# Patient Record
Sex: Female | Born: 1980 | Hispanic: Yes | Marital: Married | Smoking: Never smoker
Health system: Southern US, Community
[De-identification: ages and names within clinical notes are randomized; demographics above are authoritative.]

## PROBLEM LIST (undated history)

## (undated) DIAGNOSIS — J45909 Unspecified asthma, uncomplicated: Secondary | ICD-10-CM

## (undated) DIAGNOSIS — J309 Allergic rhinitis, unspecified: Secondary | ICD-10-CM

## (undated) HISTORY — DX: Allergic rhinitis, unspecified: J30.9

## (undated) HISTORY — DX: Unspecified asthma, uncomplicated: J45.909

---
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
## 2017-06-18 DIAGNOSIS — N920 Excessive and frequent menstruation with regular cycle: Secondary | ICD-10-CM | POA: Diagnosis not present

## 2017-06-18 DIAGNOSIS — N83209 Unspecified ovarian cyst, unspecified side: Secondary | ICD-10-CM | POA: Diagnosis not present

## 2017-06-18 DIAGNOSIS — N938 Other specified abnormal uterine and vaginal bleeding: Secondary | ICD-10-CM | POA: Diagnosis not present

## 2017-06-25 ENCOUNTER — Encounter: Payer: Self-pay | Admitting: Obstetrics & Gynecology

## 2017-06-25 ENCOUNTER — Ambulatory Visit (INDEPENDENT_AMBULATORY_CARE_PROVIDER_SITE_OTHER): Payer: BLUE CROSS/BLUE SHIELD | Admitting: Obstetrics & Gynecology

## 2017-06-25 VITALS — BP 120/80 | HR 87 | Ht 65.0 in | Wt 188.0 lb

## 2017-06-25 DIAGNOSIS — N920 Excessive and frequent menstruation with regular cycle: Secondary | ICD-10-CM | POA: Insufficient documentation

## 2017-06-25 NOTE — Progress Notes (Signed)
HPI:      Teresa Willis is a 36 y.o. G1P1001 who LMP was Patient's last menstrual period was 05/22/2017., presents today for a problem visit.  She complains of menorrhagia that  began about a month ago and its severity is described as moderate.  She has regular periods every 28 days and they are associated with no menstrual cramping.  She has used the following for attempts at control: maxi pad.  July was prolonged with almost 30 days of bleeding, clots at times, no pain.  Previous evaluation: none. Prior Diagnosis: none. Previous Treatment: none.  She is single partner, contraception - vasectomy.  Contraception: vasectomy. Hx of STDs: none. She is premenopausal.  PMHx: She  has no past medical history on file. Also,  has a past surgical history that includes Appendectomy and Cesarean section., family history includes Colon cancer in her paternal grandfather.,  reports that she has never smoked. She has never used smokeless tobacco. She reports that she does not drink alcohol or use drugs.  She currently has no medications in their medication list. Also, is allergic to sulfa antibiotics.  Review of Systems  Constitutional: Negative for chills, fever and malaise/fatigue.  HENT: Negative for congestion, sinus pain and sore throat.   Eyes: Negative for blurred vision and pain.  Respiratory: Negative for cough and wheezing.   Cardiovascular: Negative for chest pain and leg swelling.  Gastrointestinal: Negative for abdominal pain, constipation, diarrhea, heartburn, nausea and vomiting.  Genitourinary: Negative for dysuria, frequency, hematuria and urgency.  Musculoskeletal: Negative for back pain, joint pain, myalgias and neck pain.  Skin: Negative for itching and rash.  Neurological: Negative for dizziness, tremors and weakness.  Endo/Heme/Allergies: Does not bruise/bleed easily.  Psychiatric/Behavioral: Negative for depression. The patient is not nervous/anxious and does not  have insomnia.     Objective: BP 120/80   Pulse 87   Ht 5\' 5"  (1.651 m)   Wt 188 lb (85.3 kg)   LMP 05/22/2017   BMI 31.28 kg/m  Physical Exam  Constitutional: She is oriented to person, place, and time. She appears well-developed and well-nourished. No distress.  Genitourinary: Vagina normal and uterus normal. Pelvic exam was performed with patient supine. There is no rash, tenderness or lesion on the right labia. There is no rash, tenderness or lesion on the left labia. No erythema or bleeding in the vagina. Right adnexum does not display mass and does not display tenderness. Left adnexum does not display mass and does not display tenderness. Cervix does not exhibit motion tenderness, discharge, polyp or nabothian cyst.   Uterus is mobile and midaxial. Uterus is not enlarged or exhibiting a mass.  Cardiovascular: Regular rhythm.  Exam reveals no gallop.   No murmur heard. Pulmonary/Chest: Effort normal and breath sounds normal.  Abdominal: Soft. She exhibits no distension. There is no tenderness.  Musculoskeletal: Normal range of motion.  Neurological: She is alert and oriented to person, place, and time. No cranial nerve deficit.  Skin: Skin is warm and dry.  Psychiatric: She has a normal mood and affect.    ASSESSMENT/PLAN:  menorrhagia  Problem List Items Addressed This Visit      Other   Menorrhagia with regular cycle - Primary   Relevant Orders   US Transvaginal Non-OB    Patient has abnormal uterine bleeding . She has a normal exam today, with no evidence of lesions.  Evaluation includes the following: exam, labs such as hormonal testing, and pelvic ultrasound to evaluate for any structural  gynecologic abnormalities.  Patient to follow up after testing.  Treatment option for menorrhagia or menometrorrhagia discussed in great detail with the patient.  Options include hormonal therapy, IUD therapy such as Mirena, D&C, Ablation, and Hysterectomy.  The pros and cons of each  option discussed with patient.   Annamarie Major, MD, [facility], [facility] 06/25/2017  9:24 AM

## 2017-06-25 NOTE — Patient Instructions (Signed)
Menorragia  (Menorrhagia)  Se llama menorragia a los periodos menstruales abundantes o que duran ms de lo habitual.  CUIDADOS EN EL HOGAR   Slo tome los medicamentos que le haya indicado el mdico.   Tome los comprimidos de hierro segn las indicaciones del mdico. El sangrado abundante puede causar bajos niveles de hierro en el organismo.   Notome aspirina desde 1 semana antes ni durante el perodo menstrual. La aspirina puede hacer que la hemorragia empeore.   Recustese un rato si debe cambiar el tampn o el apsito ms de una vez en 2 horas. Esto puede ayudar a disminuir el sangrado.   Consuma una dieta saludable y alimentos ricos en hierro. Entre estos alimentos se incluyen vegetales de hoja verde, carne, hgado, huevos y panes y cereales de grano entero.   No trate de perder peso. Espere hasta que la hemorragia se haya detenido y sus niveles de hierro vuelvan a ser normales.    SOLICITE AYUDA SI:   Empapa un tampn o un apsito cada 1 o 2 horas, y esto le ocurre cada vez que tiene el perodo.   Necesita usar apsitos y tampones al mismo tiempo porque pierde demasiada sangre.   Debe cambiarse el apsito o el tampn durante la noche.   Tiene un perodo que dura ms de 8 das.   Elimina cogulos ms grandes que 1 pulgada (2,5 cm).   Tiene perodos irregulares que ocurren ms o menos de una vez al mes.   Se siente mareada o se desvanece (se desmaya).   Se siente muy dbil o cansada.   Le falta el aire o siente que el corazn late muy rpido al hacer ejercicios.   Tiene ganas de vomitar ( nuseas ) y devuelve ( vomita ) mientras est tomando el medicamento.   Tiene heces acuosas (diarrea) mientras toma los medicamentos.   Tiene algn problema que pueda relacionarse con el medicamento que est tomando.    SOLICITE AYUDA DE INMEDIATO SI:   Empapa 4 o ms apsitos o tampones en 2 horas.   Tiene sangrado y est embarazada.    ASEGRESE DE QUE:   Comprende estas instrucciones.   Controlar su  afeccin.   Recibir ayuda de inmediato si no mejora o si empeora.    Esta informacin no tiene como fin reemplazar el consejo del mdico. Asegrese de hacerle al mdico cualquier pregunta que tenga.  Document Released: 11/28/2010 Document Revised: 06/28/2013 Document Reviewed: 04/27/2013  Elsevier Interactive Patient Education  2017 [employer]

## 2017-06-29 LAB — IGP, APTIMA HPV
HPV Aptima: NEGATIVE
PAP SMEAR COMMENT: 0

## 2017-07-01 ENCOUNTER — Ambulatory Visit (INDEPENDENT_AMBULATORY_CARE_PROVIDER_SITE_OTHER): Payer: BLUE CROSS/BLUE SHIELD

## 2017-07-01 ENCOUNTER — Encounter: Payer: Self-pay | Admitting: Obstetrics & Gynecology

## 2017-07-01 ENCOUNTER — Ambulatory Visit (INDEPENDENT_AMBULATORY_CARE_PROVIDER_SITE_OTHER): Payer: BLUE CROSS/BLUE SHIELD | Admitting: Obstetrics & Gynecology

## 2017-07-01 VITALS — BP 100/70 | HR 75 | Ht 65.0 in | Wt 187.0 lb

## 2017-07-01 DIAGNOSIS — N920 Excessive and frequent menstruation with regular cycle: Secondary | ICD-10-CM

## 2017-07-01 NOTE — Progress Notes (Signed)
  HPI: Dysfunctional Uterine Bleeding Patient complains of irregular menses. She has had one recent period abnormality and one episode last year, but other periods are reg and 2-3 days.  Recent abnormality was prolonged and heavy.  See prior note.  Ultrasound demonstrates no masses seen These findings are Pelvis normal  PMHx: She  has no past medical history on file. Also,  has a past surgical history that includes Appendectomy and Cesarean section., family history includes Colon cancer in her paternal grandfather.,  reports that she has never smoked. She has never used smokeless tobacco. She reports that she does not drink alcohol or use drugs.  She currently has no medications in their medication list. Also, is allergic to sulfa antibiotics.  Review of Systems  All other systems reviewed and are negative.   Objective: BP 100/70   Pulse 75   Ht 5\' 5"  (1.651 m)   Wt 187 lb (84.8 kg)   BMI 31.12 kg/m   Physical examination Constitutional NAD, Conversant  Skin No rashes, lesions or ulceration.   Extremities: Moves all appropriately.  Normal ROM for age. No lymphadenopathy.  Neuro: Grossly intact  Psych: Oriented to PPT.  Normal mood. Normal affect.   Assessment:  Menorrhagia with regular cycle But most cycles still normal. Cont to watch for pattern. Treatment option for menorrhagia or menometrorrhagia discussed in great detail with the patient.  Options include hormonal therapy, IUD therapy such as Mirena, D&C, Ablation, and Hysterectomy.  The pros and cons of each option discussed with patient.   Annamarie Major, MD, [facility], [facility] 07/01/2017  10:52 AM

## 2017-08-03 ENCOUNTER — Ambulatory Visit: Payer: Self-pay | Admitting: Family Medicine

## 2017-08-05 ENCOUNTER — Encounter: Payer: Self-pay | Admitting: Family Medicine

## 2017-08-05 ENCOUNTER — Ambulatory Visit (INDEPENDENT_AMBULATORY_CARE_PROVIDER_SITE_OTHER): Payer: BLUE CROSS/BLUE SHIELD | Admitting: Family Medicine

## 2017-08-05 VITALS — BP 120/62 | HR 84 | Temp 98.0°F | Ht 63.0 in | Wt 186.0 lb

## 2017-08-05 DIAGNOSIS — E663 Overweight: Secondary | ICD-10-CM | POA: Insufficient documentation

## 2017-08-05 DIAGNOSIS — E669 Obesity, unspecified: Secondary | ICD-10-CM

## 2017-08-05 DIAGNOSIS — E66811 Obesity, class 1: Secondary | ICD-10-CM

## 2017-08-05 DIAGNOSIS — Z0000 Encounter for general adult medical examination without abnormal findings: Secondary | ICD-10-CM | POA: Insufficient documentation

## 2017-08-05 NOTE — Assessment & Plan Note (Signed)
Preventative protocols reviewed and updated unless pt declined. Discussed healthy diet and lifestyle.  

## 2017-08-05 NOTE — Progress Notes (Signed)
BP 120/62 (BP Location: Left Arm, Patient Position: Sitting, Cuff Size: Normal)   Pulse 84   Temp 98 F (36.7 C) (Oral)   Ht  (1.6 m)   Wt 186 lb (84.4 kg)   LMP 07/12/2017   SpO2 98%   BMI 32.95 kg/m    CC: new pt to establish Subjective:    Patient ID: Teresa Willis, female    DOB: [birth year], 36 y.o.   MRN: [id number]  HPI: Teresa Willis is a 36 y.o. female presenting on 08/05/2017 for New Patient (Initial Visit)   Spanish speaking patient.  Saw OBGYN Dr Tiburcio Pea for menorrhagia, treated with watchful waiting. Cycles have normalized.   Preventative: Well woman with OBGYN Dr Tiburcio Pea, pap WNL 06/2017 labwork - had labwork done 1 yr ago.  Flu shot - declines Tetanus 2010 - will bring [place] immunization records Seat belt use discussed Sunscreen use discussed. No changing moles on skin.  Non smoker Alcohol  None LMP 07/2017  Lives with husband, son Barbara Cower and step daughter Annye Asa: unemployed, housewife Activity: walking at local park - every morning Diet: good water intake, fruits/vegetables daily, doesn't like fish or seafood  Relevant past medical, surgical, family and social history reviewed and updated as indicated. Interim medical history since our last visit reviewed. Allergies and medications reviewed and updated. No outpatient prescriptions prior to visit.   No facility-administered medications prior to visit.      Per HPI unless specifically indicated in ROS section below Review of Systems     Objective:    BP 120/62 (BP Location: Left Arm, Patient Position: Sitting, Cuff Size: Normal)   Pulse 84   Temp 98 F (36.7 C) (Oral)   Ht  (1.6 m)   Wt 186 lb (84.4 kg)   LMP 07/12/2017   SpO2 98%   BMI 32.95 kg/m   Wt Readings from Last 3 Encounters:  08/05/17 186 lb (84.4 kg)  07/01/17 187 lb (84.8 kg)  06/25/17 188 lb (85.3 kg)    Physical Exam  Constitutional: She is oriented to person, place, and time. She appears well-developed  and well-nourished. No distress.  HENT:  Head: Normocephalic and atraumatic.  Right Ear: Hearing, tympanic membrane, external ear and ear canal normal.  Left Ear: Hearing, tympanic membrane, external ear and ear canal normal.  Nose: Nose normal.  Mouth/Throat: Uvula is midline, oropharynx is clear and moist and mucous membranes are normal. No oropharyngeal exudate, posterior oropharyngeal edema or posterior oropharyngeal erythema.  Eyes: Pupils are equal, round, and reactive to light. Conjunctivae and EOM are normal. No scleral icterus.  Neck: Normal range of motion. Neck supple. No thyromegaly present.  Cardiovascular: Normal rate, regular rhythm, normal heart sounds and intact distal pulses.   No murmur heard. Pulses:      Radial pulses are 2+ on the right side, and 2+ on the left side.  Pulmonary/Chest: Effort normal and breath sounds normal. No respiratory distress. She has no wheezes. She has no rales.  Abdominal: Soft. Bowel sounds are normal. She exhibits no distension and no mass. There is no tenderness. There is no rebound and no guarding.  Musculoskeletal: Normal range of motion. She exhibits no edema.  Lymphadenopathy:    She has no cervical adenopathy.  Neurological: She is alert and oriented to person, place, and time.  CN grossly intact, station and gait intact  Skin: Skin is warm and dry. No rash noted.  Psychiatric: She has a normal mood and affect. Her behavior  is normal. Judgment and thought content normal.  Nursing note and vitals reviewed.     Assessment & Plan:   Problem List Items Addressed This Visit    Health maintenance examination - Primary    Preventative protocols reviewed and updated unless pt declined. Discussed healthy diet and lifestyle.       Obesity, Class I, BMI 30-34.9    Reviewed active lifestyle changes.       Relevant Orders   Lipid panel   Comprehensive metabolic panel   TSH       Follow up plan: Return in about 1 year (around  08/05/2018) for annual exam, prior fasting for blood work.  Eustaquio Boyden, MD

## 2017-08-05 NOTE — Assessment & Plan Note (Signed)
Reviewed active lifestyle changes.

## 2017-08-05 NOTE — Patient Instructions (Addendum)
Gusto verla hoy. regresar en ayunas la semana entrante para laboratorios. regresar cuando necesite o en 1 ao para proximo examen fisico.  RTC 1 wk labwork.   [place] (Health Maintenance, Female) Un estilo de vida saludable y los cuidados preventivos pueden favorecer considerablemente a la salud y Musician. Pregunte a su mdico cul es el cronograma de exmenes peridicos apropiado para usted. Esta es una buena oportunidad para consultarlo sobre cmo prevenir enfermedades y [place] sano. Adems de los controles, hay muchas otras cosas que puede hacer usted mismo. Los expertos han realizado numerosas investigaciones [employer]itor cambios en el estilo de vida y las medidas de prevencin que, [place], lo ayudarn a mantenerse sano. Solicite a su mdico ms informacin. EL PESO Y LA DIETA Consuma una dieta saludable.  Asegrese de [employer] Stores verduras, frutas, productos lcteos de bajo contenido de [place] y Advertising account planner.  No consuma muchos alimentos de alto contenido de grasas slidas, azcares agregados o sal.  Realice actividad fsica con regularidad. Esta es una de las prcticas ms importantes que puede hacer por su salud. ? La Delorise Shiner de los adultos deben hacer ejercicio durante al menos 179mnutos por semana. El ejercicio debe aumentar la frecuencia cardaca y pActorla transpiracin (ejercicio de [place]. ? La mayora de los adultos tambin deben hacer ejercicios de elongacin al mToysRusveces a la semana. Agregue esto al su plan de ejercicio de intensidad moderada. Mantenga un peso saludable.  El ndice de masa corporal ([facility] es una medida que puede utilizarse para identificar posibles problemas de pBladensburg Proporciona una estimacin de la grasa corporal basndose en el peso y la altura. Su mdico puede ayudarle a dRadiation protection practitionerIDakotay a lScientist, forensico mTheatre managerun peso saludable.  Para las mujeres de 20aos o ms: ? Un IGreater Long Beach Endoscopymenor de  18,5 se considera bajo peso. ? Un IPutnam Community Medical Centerentre 18,5 y 24,9 es normal. ? Un ICommunity Hospital Monte[facility] Peninsulaentre 25 y 29,9 se considera sobrepeso. ? Un IMC de 30 o ms se considera obesidad. Observe los niveles de colesterol y lpidos en la sangre.  Debe comenzar a rEnglish as a second language teacherde lpidos y cResearch officer, trade unionen la sangre a los 20aos y luego repetirlos cada 580aos  Es posible que nAutomotive engineerlos niveles de colesterol con mayor frecuencia si: ? Sus niveles de lpidos y colesterol son altos. ? Es mayor de [id number] ? Presenta un alto riesgo de padecer enfermedades cardacas. DETECCIN DE CNCER Cncer de pulmn  Se recomienda realizar exmenes de deteccin de cncer de pulmn a personas adultas entre 529y 857aos que estn en riesgo de dHorticulturist, commercialde pulmn por sus antecedentes de consumo de tabaco.  Se recomienda una tomografa computarizada de baja dosis de los pulmones todos los aos a las personas que: ? Fuman actualmente. ? Hayan dejado el hbito en algn momento en los ltimos 15aos. ? Hayan fumado durante 30aos un paquete diario. Un paquete-ao equivale a fumar un promedio de un paquete de cigarrillos diario durante un ao.  Los exmenes de deteccin anuales deben continuar hasta que hayan pasado 15aos desde que dej de fumar.  Ya no debern realizarse si tiene un problema de salud que le impida recibir tratamiento para eScience writerde pulmn. Cncer de mama  Practique la autoconciencia de la mama. Esto significa reconocer la apariencia normal de sus mamas y cmo las siente.  Tambin significa realizar autoexmenes regulares de lJohnson & Johnson Informe a su mdico sobre cualquier cambio, sin importar cun pequeo  sea.  Si tiene entre 20 y 10 aos, un mdico debe realizarle un examen clnico de las mamas como parte del examen regular de [place], cada 1 a 3aos.  Si tiene 40aos o ms, debe Information systems manager clnico de las Microsoft. Tambin considere realizarse una [place] todos los [place].  Si tiene antecedentes familiares de cncer de mama, hable con su mdico para someterse a un estudio gentico.  Si tiene alto riesgo de Chief Financial Officer de mama, hable con su mdico para someterse a Public house manager y [employer].  La evaluacin del gen del cncer de mama (BRCA) se recomienda a mujeres que tengan familiares con cnceres relacionados con el BRCA. Los cnceres relacionados con el BRCA incluyen los siguientes: ? [place]. ? Ovario. ? Trompas. ? Cnceres de peritoneo.  Los resultados de la evaluacin determinarn la necesidad de asesoramiento gentico y de [place] de BRCA1 y BRCA2. Cncer de cuello del tero El mdico puede recomendarle que se haga pruebas peridicas de deteccin de cncer de los rganos de la pelvis (ovarios, tero y vagina). Estas pruebas incluyen un examen plvico, que abarca controlar si se produjeron cambios microscpicos en la superficie del cuello del tero (prueba de Papanicolaou). Pueden recomendarle que se haga estas pruebas cada 3aos, a partir de los 21aos.  A las mujeres que tienen entre 30 y 3aos, los mdicos pueden recomendarles que se sometan a exmenes plvicos y pruebas de Papanicolaou cada 36aos, o a la prueba de Papanicolaou y el examen plvico en combinacin con estudios de deteccin del virus del papiloma humano (VPH) cada 5aos. Algunos tipos de VPH aumentan el riesgo de Chief Financial Officer de cuello del tero. La prueba para la deteccin del VPH tambin puede realizarse a mujeres de cualquier edad cuyos resultados de la prueba de Papanicolaou no sean claros.  Es posible que otros mdicos no recomienden exmenes de deteccin a mujeres no embarazadas que se consideran sujetos de bajo riesgo de Chief Financial Officer de pelvis y que no tienen sntomas. Pregntele al mdico si un examen plvico de deteccin es adecuado para usted.  Si ha recibido un tratamiento para Science writer cervical o una  enfermedad que podra causar cncer, necesitar realizarse una prueba de Papanicolaou y controles durante al menos 18 aos de concluido el [place]. Si no se ha hecho el Papanicolaou con regularidad, debern volver a evaluarse los factores de riesgo (como tener un nuevo compaero sexual), para Teacher, adult education si debe realizarse los estudios nuevamente. Algunas mujeres sufren problemas mdicos que aumentan la probabilidad de Museum/gallery curator cncer de cuello del tero. En estos casos, el mdico podr QUALCOMM se realicen controles y pruebas de Papanicolaou con ms frecuencia. Cncer colorrectal  Este tipo de cncer puede detectarse y a menudo prevenirse.  Por lo general, los estudios de rutina se deben Medical laboratory scientific officer a Field seismologist a Proofreader de los 41 aos y [place] 17 aos.  Sin embargo, el mdico podr aconsejarle que lo haga antes, si tiene factores de riesgo para el cncer de colon.  Tambin puede recomendarle que use un kit de prueba para Hydrologist en la materia fecal.  Es posible que se use una pequea cmara en el extremo de un tubo para examinar directamente el colon (sigmoidoscopia o colonoscopia) a fin de Hydrographic surveyor formas tempranas de cncer colorrectal.  Los exmenes de rutina generalmente comienzan a los 50aos.  El examen directo del colon se debe repetir cada 5 a 10aos hasta los 75aos.  Sin embargo, es posible que se realicen exmenes con mayor frecuencia, si se detectan formas tempranas de plipos precancerosos o pequeos bultos. Cncer de piel  Revise la piel de la cabeza a los pies con regularidad.  Informe a su mdico si aparecen nuevos lunares o los que tiene se modifican, especialmente en su forma y color.  Tambin notifique al mdico si tiene un lunar que es ms grande que el tamao de una goma de lpiz.  Siempre use pantalla solar. Aplique pantalla solar de [place] Dory y repetida a lo largo del Training and development officer.  Protjase usando mangas y [employer], un sombrero de ala ancha y  gafas para el sol, siempre que se encuentre en el exterior. ENFERMEDADES CARDACAS, DIABETES E HIPERTENSIN ARTERIAL  La hipertensin arterial causa enfermedades cardacas y [place] el riesgo de ictus. La hipertensin arterial es ms probable en los siguientes casos: ? Las personas que tienen la presin arterial en el extremo del rango normal (100-139/85-89 mm Hg). ? [employer] Corporation con sobrepeso u obesidad. ? Scientist, water quality.  Si usted tiene entre 18 y 39 aos, debe medirse la presin arterial cada 3 a 5 aos. Si usted tiene 40 aos o ms, debe medirse la presin arterial Hewlett[employer]ard. Debe medirse la presin arterial dos veces: una vez cuando est en un hospital o una clnica y la otra vez cuando est en otro sitio. Registre el promedio de [employer]. Para controlar su presin arterial cuando no est en un hospital o [place], puede usar lo siguiente: ? Jorje Guild automtica para medir la presin arterial en una farmacia. ? Un monitor para medir la presin arterial en el hogar.  Si tiene entre 66 y 44 aos, consulte a su mdico si debe tomar aspirina para prevenir el ictus.  Realcese exmenes de deteccin de la diabetes con regularidad. Esto incluye la toma de [place] de sangre para controlar el nivel de azcar en la sangre durante el [place]. ? Si tiene un peso normal y un bajo riesgo de padecer diabetes, realcese este anlisis cada tres aos despus de los 45aos. ? Si tiene sobrepeso y un alto riesgo de padecer diabetes, considere someterse a este anlisis antes o con mayor frecuencia. PREVENCIN DE INFECCIONES HepatitisB  Si tiene un riesgo ms alto de Museum/gallery curator hepatitis B, debe someterse a un examen de deteccin de este virus. Se considera que tiene un alto riesgo de contraer hepatitis B si: ? Naci en un pas donde la hepatitis B es frecuente. Pregntele a su mdico qu pases son considerados de Public affairs consultant. ? Sus padres nacieron en un pas de alto riesgo y  usted no recibi una vacuna que lo proteja contra la hepatitis B (vacuna contra la hepatitis B). ? [place]. ? [place] agujas para inyectarse drogas. ? Vive con alguien que tiene hepatitis B. ? Ha tenido sexo con alguien que tiene hepatitis B. ? Recibe tratamiento de hemodilisis. ? Toma ciertos medicamentos para el cncer, trasplante de rganos y afecciones autoinmunitarias. Hepatitis C  Se recomienda un anlisis de [place] para: ? [employer] 1945 y 1965. ? Todas las personas que tengan un riesgo de haber contrado hepatitis C. Enfermedades de transmisin sexual (ETS).  Debe realizarse pruebas de deteccin de enfermedades de transmisin sexual (ETS), incluidas gonorrea y clamidia si: ? Es sexualmente activo y es menor de 11BJY. ? Es mayor de 24aos, y Investment banker, operational informa que corre riesgo de tener este tipo de infecciones. ?  La actividad sexual ha cambiado desde que le hicieron la ltima prueba de deteccin y tiene un riesgo mayor de Best boy clamidia o Radio broadcast assistant. Pregntele al mdico si usted tiene riesgo.  Si no tiene el VIH, pero corre riesgo de infectarse por el virus, se recomienda tomar diariamente un medicamento recetado para evitar la infeccin. Esto se conoce como profilaxis previa a la exposicin. Se considera que est en riesgo si: ? Es [place] sexualmente y no [place] preservativos habitualmente o no conoce el estado del VIH de sus Advertising copywriter. ? Se inyecta drogas. ? Es [place] sexualmente con Ardelia Mems pareja que tiene VIH. Consulte a su mdico para saber si tiene un alto riesgo de infectarse por el VIH. Si opta por comenzar la profilaxis previa a la exposicin, primero debe realizarse anlisis de deteccin del VIH. Luego, le harn anlisis cada 90mses mientras est tomando los medicamentos para la profilaxis previa a la exposicin. [facility] Si es premenopusica y puede quedar [place] solicite a su mdico asesoramiento previo a la concepcin.  Si puede  quedar embarazada, tome 400 a [id number](mcg) de cido fAnheuser-Busch  Si desea evitar el embarazo, hable con su mdico sobre el control de la natalidad (anticoncepcin). OSTEOPOROSIS Y MENOPAUSIA  La osteoporosis es una enfermedad en la que los huesos pierden los minerales y la fuerza por el avance de la edad. El resultado pueden ser fracturas graves en los hBoron El riesgo de osteoporosis puede identificarse con uArdelia Memsprueba de densidad sea.  Si tiene 65aos o ms, o si est en riesgo de sufrir osteoporosis y fracturas, pregunte a su mdico si debe someterse a exmenes.  Consulte a su mdico si debe tomar un suplemento de calcio o de vitamina D para reducir el riesgo de osteoporosis.  La menopausia puede presentar ciertos sntomas fsicos y rGaffer  La terapia de reemplazo hormonal puede reducir algunos de estos sntomas y rGaffer Consulte a su mdico para saber si la terapia de reemplazo hormonal es conveniente para usted. INSTRUCCIONES PARA EL CUIDADO EN EL HOGAR  Realcese los estudios de rutina de la salud, dentales y de lPublic librarian  [place]  No consuma ningn producto que contenga tabaco, lo que incluye cigarrillos, tabaco de mHigher education careers advisero cPsychologist, sport and exercise  Si est embarazada, no beba alcohol.  Si est amamantando, reduzca el consumo de alcohol y la frecuencia con la que consume.  Si es mujer y no est embarazada limite el consumo de alcohol a no ms de 1 medida por da. Una medida equivale a 12onzas de cerveza, 5onzas de vino o 1onzas de bebidas alcohlicas de alta graduacin.  No consuma drogas.  No comparta agujas.  Solicite ayuda a su mdico si necesita apoyo o informacin para abandonar las drogas.  Informe a su mdico si a menudo se siente deprimido.  Notifique a su mdico si alguna vez ha sido vctima de abuso o si no se siente seguro en su hogar. Esta informacin no tiene cMarine scientistel consejo del mdico.  Asegrese de hacerle al mdico cualquier pregunta que tenga. Document Released: 10/15/2011 Document Revised: 11/16/2014 Document Reviewed: 07/30/2015 Elsevier Interactive Patient Education  2[employer]

## 2017-08-06 ENCOUNTER — Other Ambulatory Visit (INDEPENDENT_AMBULATORY_CARE_PROVIDER_SITE_OTHER): Payer: BLUE CROSS/BLUE SHIELD

## 2017-08-06 DIAGNOSIS — E66811 Obesity, class 1: Secondary | ICD-10-CM

## 2017-08-06 DIAGNOSIS — E669 Obesity, unspecified: Secondary | ICD-10-CM

## 2017-08-06 LAB — COMPREHENSIVE METABOLIC PANEL
ALT: 14 U/L (ref 0–35)
AST: 14 U/L (ref 0–37)
Albumin: 3.8 g/dL (ref 3.5–5.2)
Alkaline Phosphatase: 55 U/L (ref 39–117)
BILIRUBIN TOTAL: 0.4 mg/dL (ref 0.2–1.2)
BUN: 13 mg/dL (ref 6–23)
CALCIUM: 9 mg/dL (ref 8.4–10.5)
CO2: 26 meq/L (ref 19–32)
CREATININE: 0.62 mg/dL (ref 0.40–1.20)
Chloride: 105 mEq/L (ref 96–112)
GFR: 116.96 mL/min (ref >60.00)
GLUCOSE: 109 mg/dL — AB (ref 70–99)
Potassium: 4.4 mEq/L (ref 3.5–5.1)
Sodium: 135 mEq/L (ref 135–145)
Total Protein: 6.8 g/dL (ref 6.0–8.3)

## 2017-08-06 LAB — LIPID PANEL
CHOL/HDL RATIO: 2
Cholesterol: 122 mg/dL (ref 0–200)
HDL: 49.2 mg/dL (ref >39.00)
LDL Cholesterol: 53 mg/dL (ref 0–99)
NonHDL: 72.55
Triglycerides: 99 mg/dL (ref 0.0–149.0)
VLDL: 19.8 mg/dL (ref 0.0–40.0)

## 2017-08-06 LAB — TSH: TSH: 1.16 u[IU]/mL (ref 0.35–4.50)

## 2018-01-12 ENCOUNTER — Encounter: Payer: Self-pay | Admitting: Family Medicine

## 2018-01-12 ENCOUNTER — Ambulatory Visit: Payer: 59 | Admitting: Family Medicine

## 2018-01-12 VITALS — BP 120/80 | HR 89 | Temp 97.8°F | Wt 182.0 lb

## 2018-01-12 DIAGNOSIS — H6501 Acute serous otitis media, right ear: Secondary | ICD-10-CM | POA: Diagnosis not present

## 2018-01-12 MED ORDER — FLUTICASONE PROPIONATE 50 MCG/ACT NA SUSP: 2.0000 | Freq: Every day | NASAL | 1 refills | Status: DC

## 2018-01-12 NOTE — Assessment & Plan Note (Signed)
S/p already treated URI/AOM with amox 10d course she had at home. Now with residual serous otitis. Reviewed anticipated course of recovery. rec flonase, NSAID. Update if new or worsening sxs. Pt agrees with plan.

## 2018-01-12 NOTE — Progress Notes (Signed)
BP 120/80 (BP Location: Left Arm, Patient Position: Sitting, Cuff Size: Normal)   Pulse 89   Temp 97.8 F (36.6 C) (Oral)   Wt 182 lb (82.6 kg)   LMP 12/11/2017   SpO2 98%   BMI 32.24 kg/m    CC: ear pain, dizziness Subjective:    Patient ID: Teresa Willis, female    DOB: [birth year], 37 y.o.   MRN: [id number]  HPI: Teresa Willis is a 37 y.o. female presenting on 01/12/2018 for Ear Pain (Right ear pain. Started 01/07/18. Says she had a cold previously and took 10 days of an old abx rx. It cleared all the other sxs but still has ear pain.) and Dizziness (Occasionally when lying down. )   R earache started 10 days ago, associated with URI that started 2 wks ago - started with OTC cough remedies so took leftover amoxicillin from [place] 875mg  x10 days. URI sxs started 12/27/2017.   Persistent R ear congestion, muffled hearing, mild dizziness worse at night time.  URI sxs are improved.  No fevers.   No sick contacts at home.  Non smoker. Childhood asthma.   Relevant past medical, surgical, family and social history reviewed and updated as indicated. Interim medical history since our last visit reviewed. Allergies and medications reviewed and updated. No outpatient medications prior to visit.   No facility-administered medications prior to visit.      Per HPI unless specifically indicated in ROS section below Review of Systems     Objective:    BP 120/80 (BP Location: Left Arm, Patient Position: Sitting, Cuff Size: Normal)   Pulse 89   Temp 97.8 F (36.6 C) (Oral)   Wt 182 lb (82.6 kg)   LMP 12/11/2017   SpO2 98%   BMI 32.24 kg/m   Wt Readings from Last 3 Encounters:  01/12/18 182 lb (82.6 kg)  08/05/17 186 lb (84.4 kg)  07/01/17 187 lb (84.8 kg)    Physical Exam  Constitutional: She appears well-developed and well-nourished. No distress.  HENT:  Head: Normocephalic and atraumatic.  Right Ear: Hearing, external ear and ear canal normal.  Left Ear: Hearing,  tympanic membrane, external ear and ear canal normal.  Nose: No mucosal edema or rhinorrhea. Right sinus exhibits no maxillary sinus tenderness and no frontal sinus tenderness. Left sinus exhibits no maxillary sinus tenderness and no frontal sinus tenderness.  Mouth/Throat: Uvula is midline, oropharynx is clear and moist and mucous membranes are normal. No oropharyngeal exudate, posterior oropharyngeal edema, posterior oropharyngeal erythema or tonsillar abscesses.  R TM retracted, erythema superiorly No pain or drainage or perforation Nasal mucosal congestion  Eyes: Conjunctivae and EOM are normal. Pupils are equal, round, and reactive to light. No scleral icterus.  Neck: Normal range of motion. Neck supple.  Cardiovascular: Normal rate, regular rhythm, normal heart sounds and intact distal pulses.  No murmur heard. Pulmonary/Chest: Effort normal and breath sounds normal. No respiratory distress. She has no wheezes. She has no rales.  Lymphadenopathy:    She has no cervical adenopathy.  Skin: Skin is warm and dry. No rash noted.  Nursing note and vitals reviewed.     Assessment & Plan:   Problem List Items Addressed This Visit    Acute serous otitis media of right ear without rupture - Primary    S/p already treated URI/AOM with amox 10d course she had at home. Now with residual serous otitis. Reviewed anticipated course of recovery. rec flonase, NSAID. Update if new or worsening sxs. Pt  agrees with plan.           Meds ordered this encounter  Medications  . fluticasone (FLONASE) 50 MCG/ACT nasal spray    Sig: Place 2 sprays into both nostrils daily.    Dispense:  16 g    Refill:  1   No orders of the defined types were placed in this encounter.   Follow up plan: Return if symptoms worsen or fail to improve.  Eustaquio BoydenJavier Tatijana Bierly, MD

## 2018-01-12 NOTE — Patient Instructions (Signed)
Tuvo infeccion de oido. Ya tratada con amoxicilina.  Ahora tiene inflamacion residua despues de infeccion.  Esto debe mejorar con tiempo, puede tardar 3--4 semanas en irse.  Tome ibuprofeno 400 mg dos veces al dia con comida, empieze flonase esteroide nasal para inflamacion.   Otitis media serosa (Serous Otitis Media) La otitis media serosa se produce cuando se acumula lquido en el espacio del odo medio. Este espacio contiene los H&R Blockhuesos que intervienen en la audicin y [place]. El aire en el espacio del odo medio ayuda a transmitir los sonidos. El aire llega a ese lugar a travs de las trompas de [place] Lake[place]. Este conducto va desde la parte posterior de la nariz (nasofaringe) hasta el espacio del Veronaodo medio. Mantiene la misma presin en el odo medio que la que hay en el mundo exterior. Tambin ayuda a Forensic psychologistdrenar el lquido del espacio del odo Lismanmedio. CAUSAS La otitis media serosa se produce cuando las trompas de [place] se obstruyen. Las causas de la obstruccin pueden ser:  Visteon Corporationnfecciones en los odos.  Resfriados y otras infecciones de las vas respiratorias superiores.  Cualquier alergia que tenga.  Irritantes como el humo del cigarrillo.  Cambios sbitos en la presin del aire (como cuando baja el avin).  Hipertrofia de adenoides.  Un bulto en la nasofaringe. Durante los resfros y las infecciones de las vas respiratorias superiores, el espacio del odo medio puede llenarse de lquido temporariamente. Esto puede ocurrir despus de una infeccin en el odo tambin. Una vez que la infeccin se mejora, el lquido generalmente drenar fuera del odo a travs de las trompas de [place]. Si esto no ocurre, se produce la otitis media serosa. SIGNOS Y SNTOMAS  Prdida auditiva.  Sensacin de llenado en el odo, sin dolor.  Los nios pequeos pueden no manifestar sntomas, pero pueden mostrar ligeros cambios en la conducta, como estar agitados, tironearse de las orejas o  Automotive engineerllorar.  DIAGNSTICO La otitis media serosa se diagnostica por medio de un examen de los odos. Podrn indicarle estudios para verificar el movimiento del tmpano. Tambin podrn National Oilwell Varcohacerle estudios auditivos. TRATAMIENTO El lquido con frecuencia desaparece sin tratamiento. Si la causa es una Corwin Springsalergia, un tratamiento para mejorar la alergia puede ser de Hill 'n Daleayuda. El lquido que persiste durante varios meses puede requerir una Advertising account executiveciruga menor. Colocar un tubo pequeo en el tmpano para:  Drenar el lquido.  Restablecer el aire en el espacio del odo medio. En ciertas situaciones, podrn indicarle antibiticos para evitar la ciruga. La ciruga puede realizarse para extirpar la hipertrofia de adenoides (si esta es la causa). INSTRUCCIONES PARA EL CUIDADO EN EL HOGAR  Mantenga a los nios alejados del humo del tabaco.  Concurra a todas las visitas de control como se lo haya indicado el mdico.  SOLICITE ATENCIN MDICA SI:  La audicin no mejora en 3 meses.  La audicin empeora.  Siente dolor de odos.  Tiene un drenaje por el odo.  Tiene mareos.  Tiene otitis media serosa solo en un odo o sangra por la nariz (epistaxis).  Advierte un bulto en el cuello.  ASEGRESE DE QUE:  Comprende estas instrucciones.  Controlar su afeccin.  Recibir ayuda de inmediato si no mejora o si empeora.  Esta informacin no tiene Theme park managercomo fin reemplazar el consejo del mdico. Asegrese de hacerle al mdico cualquier pregunta que tenga. Document Released: 10/08/2008 Document Revised: 11/16/2014 Document Reviewed: 05/23/2013 Elsevier Interactive Patient Education  2017 ArvinMeritorElsevier Inc.

## 2018-03-03 ENCOUNTER — Encounter: Payer: Self-pay | Admitting: Family Medicine

## 2018-03-03 DIAGNOSIS — Z9189 Other specified personal risk factors, not elsewhere classified: Secondary | ICD-10-CM

## 2018-03-03 DIAGNOSIS — Z2839 Other underimmunization status: Secondary | ICD-10-CM | POA: Insufficient documentation

## 2018-06-07 ENCOUNTER — Emergency Department
Admission: EM | Admit: 2018-06-07 | Discharge: 2018-06-07 | Disposition: A | Discharge status: Home / Self Care | Payer: Commercial Managed Care - PPO | Attending: Student in an Organized Health Care Education/Training Program | Admitting: Student in an Organized Health Care Education/Training Program

## 2018-06-07 ENCOUNTER — Emergency Department: Payer: Commercial Managed Care - PPO

## 2018-06-07 ENCOUNTER — Other Ambulatory Visit: Payer: Self-pay

## 2018-06-07 DIAGNOSIS — R1084 Generalized abdominal pain: Secondary | ICD-10-CM | POA: Diagnosis present

## 2018-06-07 DIAGNOSIS — N390 Urinary tract infection, site not specified: Secondary | ICD-10-CM | POA: Diagnosis not present

## 2018-06-07 DIAGNOSIS — R109 Unspecified abdominal pain: Secondary | ICD-10-CM

## 2018-06-07 LAB — BASIC METABOLIC PANEL
Anion gap: 5 (ref 5–15)
BUN: 11 mg/dL (ref 6–20)
CALCIUM: 9.7 mg/dL (ref 8.9–10.3)
CO2: 29 mmol/L (ref 22–32)
Chloride: 105 mmol/L (ref 98–111)
Creatinine, Ser: 0.61 mg/dL (ref 0.44–1.00)
GFR calc Af Amer: >60 mL/min (ref >60)
GLUCOSE: 109 mg/dL — AB (ref 70–99)
Potassium: 4.3 mmol/L (ref 3.5–5.1)
Sodium: 139 mmol/L (ref 135–145)

## 2018-06-07 LAB — URINALYSIS, COMPLETE (UACMP) WITH MICROSCOPIC
Bilirubin Urine: NEGATIVE
GLUCOSE, UA: NEGATIVE mg/dL
KETONES UR: NEGATIVE mg/dL
NITRITE: NEGATIVE
PH: 7 (ref 5.0–8.0)
PROTEIN: NEGATIVE mg/dL
Specific Gravity, Urine: 1.014 (ref 1.005–1.030)
Squamous Epithelial / LPF: NONE SEEN (ref 0–5)

## 2018-06-07 LAB — CBC
HCT: 38.5 % (ref 35.0–47.0)
Hemoglobin: 13.4 g/dL (ref 12.0–16.0)
MCH: 31.9 pg (ref 26.0–34.0)
MCHC: 34.9 g/dL (ref 32.0–36.0)
MCV: 91.3 fL (ref 80.0–100.0)
Platelets: 283 10*3/uL (ref 150–440)
RBC: 4.22 MIL/uL (ref 3.80–5.20)
RDW: 12.8 % (ref 11.5–14.5)
WBC: 16.4 10*3/uL — ABNORMAL HIGH (ref 3.6–11.0)

## 2018-06-07 LAB — POCT PREGNANCY, URINE: PREG TEST UR: NEGATIVE

## 2018-06-07 MED ORDER — SODIUM CHLORIDE 0.9 % IV BOLUS
500.0000 mL | Freq: Once | INTRAVENOUS | Status: AC
  Administered 2018-06-07: 500 mL via INTRAVENOUS

## 2018-06-07 MED ORDER — NITROFURANTOIN MONOHYD MACRO 100 MG PO CAPS
100.0000 mg | ORAL_CAPSULE | Freq: Once | ORAL | Status: AC
  Administered 2018-06-07: 100 mg via ORAL
  Filled 2018-06-07: qty 1

## 2018-06-07 MED ORDER — KETOROLAC TROMETHAMINE 10 MG PO TABS: 10.0000 mg | ORAL_TABLET | Freq: Four times a day (QID) | ORAL | 0 refills | Status: DC | PRN

## 2018-06-07 MED ORDER — TRAMADOL HCL 50 MG PO TABS: 50.0000 mg | ORAL_TABLET | Freq: Two times a day (BID) | ORAL | 0 refills | Status: DC | PRN

## 2018-06-07 MED ORDER — HYDROMORPHONE HCL 1 MG/ML IJ SOLN
0.5000 mg | Freq: Once | INTRAMUSCULAR | Status: AC
  Administered 2018-06-07: 0.5 mg via INTRAVENOUS
  Filled 2018-06-07: qty 1

## 2018-06-07 MED ORDER — KETOROLAC TROMETHAMINE 30 MG/ML IJ SOLN
30.0000 mg | Freq: Once | INTRAMUSCULAR | Status: AC
  Administered 2018-06-07: 30 mg via INTRAVENOUS
  Filled 2018-06-07: qty 1

## 2018-06-07 MED ORDER — NITROFURANTOIN MONOHYD MACRO 100 MG PO CAPS: 100.0000 mg | ORAL_CAPSULE | Freq: Two times a day (BID) | ORAL | 0 refills | Status: AC

## 2018-06-07 NOTE — ED Notes (Signed)
Pt ambulatory upon discharge; declined wheel chair. Verbalized understanding of discharge instructions, follow-up care and prescriptions. VSS. Skin warm and dry. A&O x4.  

## 2018-06-07 NOTE — ED Notes (Signed)
See triage note  Presents with right sided back pain which started yesterday afternoon  States pain is non radiating denies any n/v  Or fever

## 2018-06-07 NOTE — ED Provider Notes (Signed)
[facility] Emergency Department Provider Note   ____________________________________________   First MD Initiated Contact with Patient 06/07/18 1429     (approximate)  I have reviewed the triage vital signs and the nursing notes.   HISTORY  Chief Complaint Flank Pain    HPI Teresa Willis is a 37 y.o. female patient complained of right flank pain which began yesterday.  Patient denies nausea, vomiting, diarrhea.  Patient has a history of kidney stones and states this feels the same.  [facility] and was sent to ED for definitive evaluation and treatment.  Patient rates her pain as 8/10.  Patient described the pain as "sharp/spasmatic".  No palliative measures for complaint.  Past Medical History:  Diagnosis Date  . Allergic rhinitis   . Childhood asthma    grew out of this    Patient Active Problem List   Diagnosis Date Noted  . Incomplete immunization status 03/03/2018  . Acute serous otitis media of right ear without rupture 01/12/2018  . Health maintenance examination 08/05/2017  . Obesity, Class I, BMI 30-34.9 08/05/2017  . Menorrhagia with regular cycle 06/25/2017    Past Surgical History:  Procedure Laterality Date  . APPENDECTOMY  1998  . CESAREAN SECTION  2014    Prior to Admission medications   Medication Sig Start Date End Date Taking? Authorizing Provider  fluticasone (FLONASE) 50 MCG/ACT nasal spray Place 2 sprays into both nostrils daily. 01/12/18   Eustaquio BoydenGutierrez, Javier, MD  ketorolac (TORADOL) 10 MG tablet Take 1 tablet (10 mg total) by mouth every 6 (six) hours as needed. 06/07/18   Joni ReiningSmith, Ronald K, PA-C  nitrofurantoin, macrocrystal-monohydrate, (MACROBID) 100 MG capsule Take 1 capsule (100 mg total) by mouth 2 (two) times daily for 7 days. 06/07/18 06/14/18  Joni ReiningSmith, Ronald K, PA-C  traMADol (ULTRAM) 50 MG tablet Take 1 tablet (50 mg total) by mouth every 12 (twelve) hours as needed. 06/07/18   Joni ReiningSmith, Ronald K, PA-C      Allergies Sulfa antibiotics  Family History  Problem Relation Age of Onset  . Colon cancer Paternal Grandfather        young age  . COPD Maternal Grandmother 60       smoker, oxygen dependent  . Diabetes Neg Hx     Social History Social History   Tobacco Use  . Smoking status: Never Smoker  . Smokeless tobacco: Never Used  Substance Use Topics  . Alcohol use: No  . Drug use: No    Review of Systems  Constitutional: No fever/chills Eyes: No visual changes. ENT: No sore throat. Cardiovascular: Denies chest pain. Respiratory: Denies shortness of breath. Gastrointestinal: No abdominal pain.  No nausea, no vomiting.  No diarrhea.  No constipation. Genitourinary: Negative for dysuria. Musculoskeletal: Flank pain. Skin: Negative for rash. Neurological: Negative for headaches, focal weakness or numbness. Allergic/Immunilogical: Sulfa antibiotics ____________________________________________   PHYSICAL EXAM:  VITAL SIGNS: ED Triage Vitals [06/07/18 1105]  Enc Vitals Group     BP 113/74     Pulse Rate 92     Resp 17     Temp 98.1 F (36.7 C)     Temp Source Oral     SpO2 100 %     Weight 172 lb (78 kg)     Height 5\' 5"  (1.651 m)     Head Circumference      Peak Flow      Pain Score 8     Pain Loc  Pain Edu?      Excl. in GC?     Constitutional: Alert and oriented. Well appearing and in no acute distress. Cardiovascular: Normal rate, regular rhythm. Grossly normal heart sounds.  Good peripheral circulation. Respiratory: Normal respiratory effort.  No retractions. Lungs CTAB. Gastrointestinal: Soft and nontender. No distention. No abdominal bruits.  Right CVA tenderness. Genitourinary: Deferred Musculoskeletal: No lower extremity tenderness nor edema.  No joint effusions. Neurologic:  Normal speech and language. No gross focal neurologic deficits are appreciated. No gait instability. Skin:  Skin is warm, dry and intact. No rash noted. Psychiatric:  Mood and affect are normal. Speech and behavior are normal.  ____________________________________________   LABS (all labs ordered are listed, but only abnormal results are displayed)  Labs Reviewed  URINALYSIS, COMPLETE (UACMP) WITH MICROSCOPIC - Abnormal; Notable for the following components:      Result Value   Color, Urine YELLOW (*)    APPearance HAZY (*)    Hgb urine dipstick SMALL (*)    Leukocytes, UA MODERATE (*)    WBC, UA >50 (*)    Bacteria, UA RARE (*)    All other components within normal limits  BASIC METABOLIC PANEL - Abnormal; Notable for the following components:   Glucose, Bld 109 (*)    All other components within normal limits  CBC - Abnormal; Notable for the following components:   WBC 16.4 (*)    All other components within normal limits  POC URINE PREG, ED  POCT PREGNANCY, URINE   ____________________________________________  EKG   ____________________________________________  RADIOLOGY  ED MD interpretation:    Official radiology report(s): Ct Renal Stone Study  Result Date: 06/07/2018 CLINICAL DATA:  Right flank pain with vaginal discharge EXAM: CT ABDOMEN AND PELVIS WITHOUT CONTRAST TECHNIQUE: Multidetector CT imaging of the abdomen and pelvis was performed following the standard protocol without IV contrast. COMPARISON:  None. FINDINGS: Lower chest: Lung bases demonstrate no acute consolidation or effusion. The heart size is within normal limits Hepatobiliary: Steatosis with fat sparing near the gallbladder fossa. No calcified stone or biliary dilatation Pancreas: Unremarkable. No pancreatic ductal dilatation or surrounding inflammatory changes. Spleen: Normal in size without focal abnormality. Adrenals/Urinary Tract: Adrenal glands are within normal limits. No definitive hydronephrosis. Dilated tubular structure in the right retroperitoneum, appears to course cephalad toward the upper aspect of the right kidney. Dilated tubular structure is  visualized down to the right pelvis. No definitive ureteral stone. The bladder is normal Stomach/Bowel: Stomach is within normal limits. Status post appendectomy. No evidence of bowel wall thickening, distention, or inflammatory changes. Vascular/Lymphatic: No significant vascular findings are present. No enlarged abdominal or pelvic lymph nodes. Reproductive: Uterus unremarkable. Hypodensity at the posterior aspect of the right uterus presumably represents the right ovary. No obvious adnexal mass Other: No free air or free fluid Musculoskeletal: No acute or significant osseous findings. IMPRESSION: 1. No definitive ureteral stone identified. No definitive hydronephrosis. Dilated tubular structure in the right retroperitoneum coursing from the upper aspect of the right kidney to the pelvis. Differential considerations include hydroureter of scarred upper pole moiety in a duplex kidney. Could also consider gonadal vein thrombosis. Contrast-enhanced CT could be helpful to further evaluate. 2. Hepatic steatosis with fat sparing near the gallbladder fossa Electronically Signed   By: Jasmine Pang M.D.   On: 06/07/2018 15:20    ____________________________________________   PROCEDURES  Procedure(s) performed: None  Procedures  Critical Care performed: No  ____________________________________________   INITIAL IMPRESSION / ASSESSMENT AND PLAN /  ED COURSE  As part of my medical decision making, I reviewed the following data within the electronic MEDICAL RECORD NUMBER    Right flank pain.  Differential consist of urinary tract infection, pyelonephritis, and renal calculus.  Discussed CT findings patient was negative for renal calculus.  Discussed lab results with patient consistent with urinary tract infection.  Patient given discharge care instructions and advised take medication as directed.  Patient advised follow-up PCP if no improvement in 2 to 3 days.       ____________________________________________   FINAL CLINICAL IMPRESSION(S) / ED DIAGNOSES  Final diagnoses:  Right flank pain  Lower urinary tract infectious disease     ED Discharge Orders        Ordered    nitrofurantoin, macrocrystal-monohydrate, (MACROBID) 100 MG capsule  2 times daily     06/07/18 1531    ketorolac (TORADOL) 10 MG tablet  Every 6 hours PRN     06/07/18 1531    traMADol (ULTRAM) 50 MG tablet  Every 12 hours PRN     06/07/18 1531       Note:  This document was prepared using Dragon voice recognition software and may include unintentional dictation errors.    Joni Reining, PA-C 06/07/18 1538    Willy Eddy, MD 06/07/18 [phone]

## 2018-06-07 NOTE — ED Triage Notes (Signed)
Pt c/o right flank pain since yesterday, clear vaginal discharge. Denies N/V/D.Marland Kitchen. [facility] for possible [facility]

## 2018-06-07 NOTE — Discharge Instructions (Addendum)
Follow discharge care instruction take medication as directed. °

## 2019-05-18 ENCOUNTER — Telehealth: Payer: Self-pay | Admitting: Family Medicine

## 2019-05-18 NOTE — Telephone Encounter (Signed)
Patient's husband called today to see if they could be tested for covid. They are going out of town in [place] and they are requesting covid testing to be done before they can travel

## 2019-05-18 NOTE — Telephone Encounter (Signed)
plz notify - we can do testing, however no guarantee it will be back in 72 hours. Unfortunately our tests are taking 3-10 days to return.  Would suggest they call [facility] to see if they can guarantee their test will return within 72 hours and if so go to [facility]. Otherwise notify us and we can order through the [facility].  

## 2019-05-18 NOTE — Telephone Encounter (Signed)
Spoke with pt's husband, Corene Cornea (on dpr) relaying Dr. Synthia Innocent message. He verbalizes understanding.  Says he will check with [facility] and will call back if he still wants Dr. Darnell Level to order test.

## 2019-09-14 IMAGING — CT CT RENAL STONE PROTOCOL
3 of 4 series · 9 of 46 positions shown, 14 images · non-contrast
Comparison: None.

CLINICAL DATA: Right flank pain with vaginal discharge

EXAM:
CT ABDOMEN AND PELVIS WITHOUT CONTRAST
TECHNIQUE: Multidetector CT imaging of the abdomen and pelvis was performed
following the standard protocol without IV contrast.

[Series 4: lung bases · axial · 0.66mm/px · z∈[-682,-602]mm · 5 of 26 slices shown, 10 images]
[im 5/26  soft-tissue]
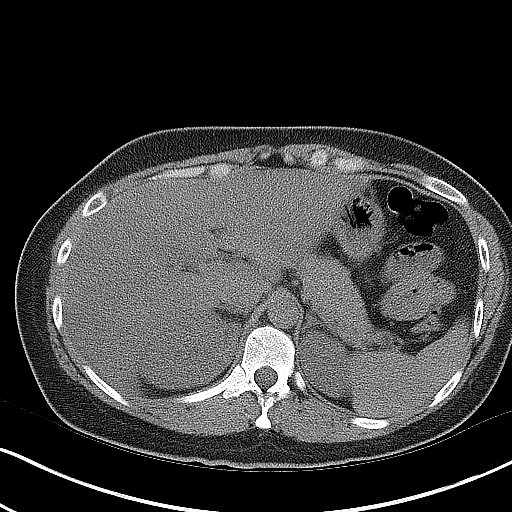
[im 5/26  bone]
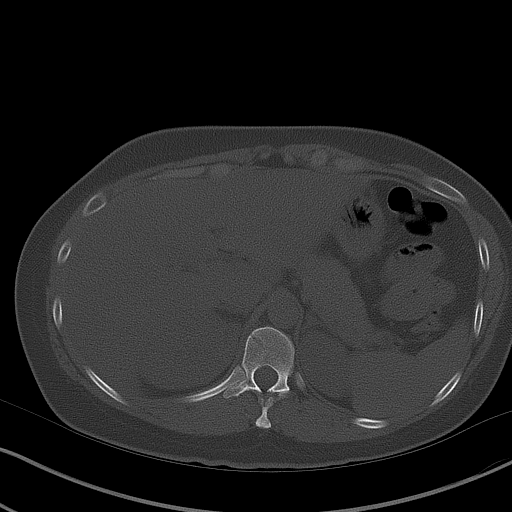
[im 9/26  soft-tissue]
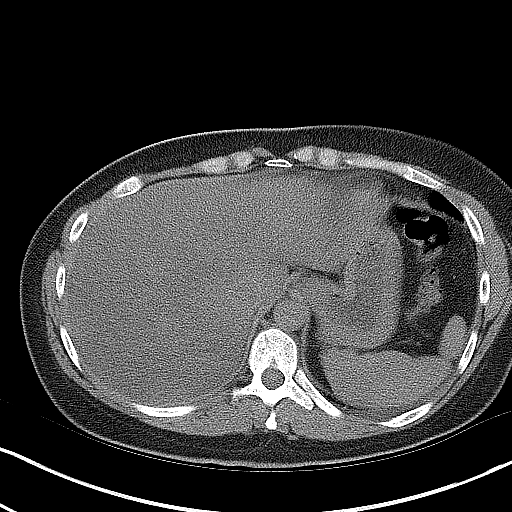
[im 9/26  lung]
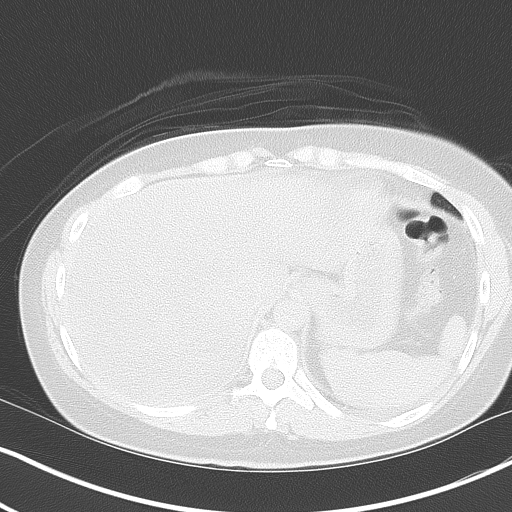
[im 13/26  soft-tissue]
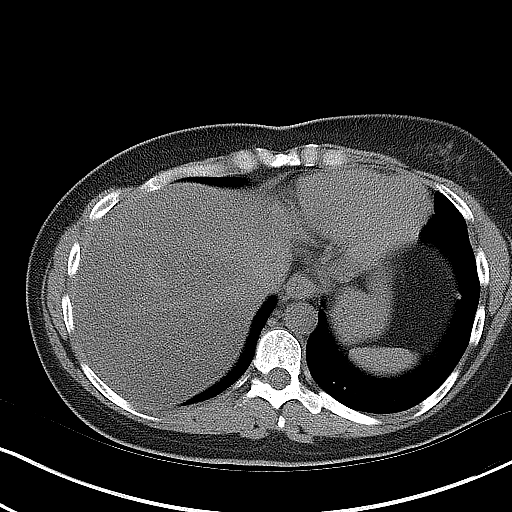
[im 13/26  lung]
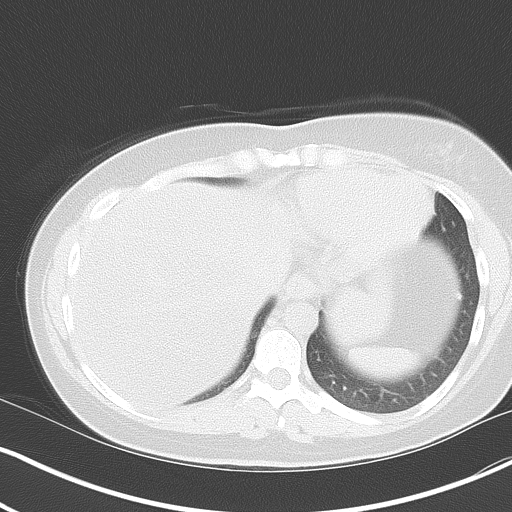
[im 17/26  soft-tissue]
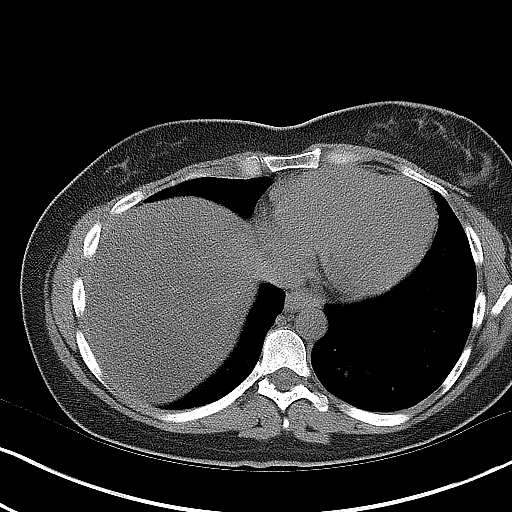
[im 17/26  lung]
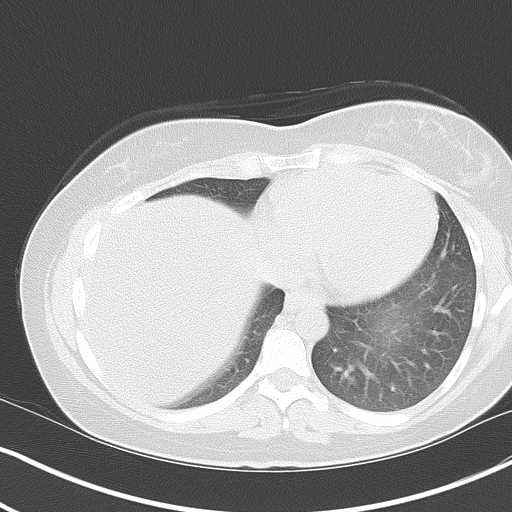
[im 21/26  soft-tissue]
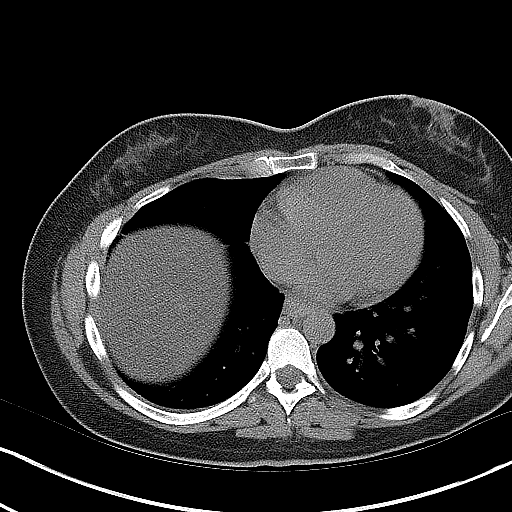
[im 21/26  lung]
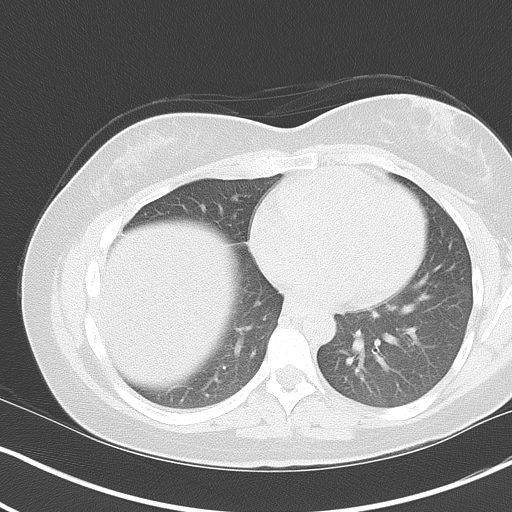

[Series 5: coronal · coronal · 0.64mm/px · 3 of 121 slices shown]
[im 41/121  soft-tissue]
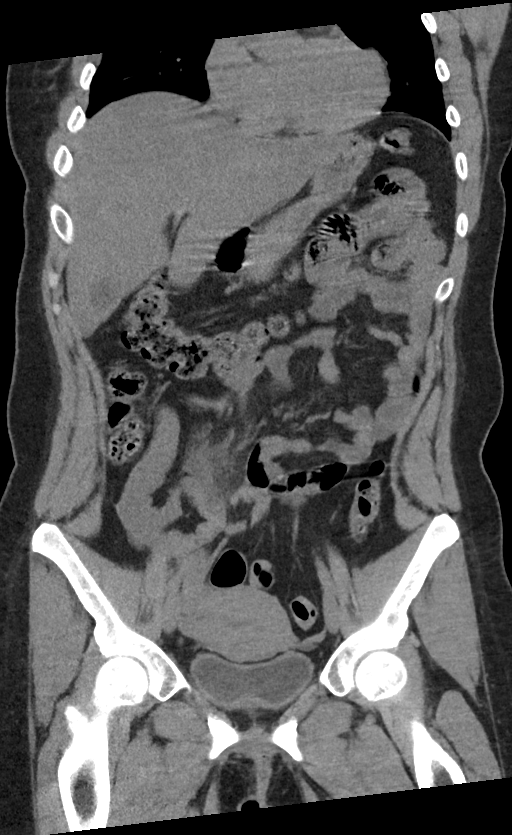
[im 54/121  soft-tissue]
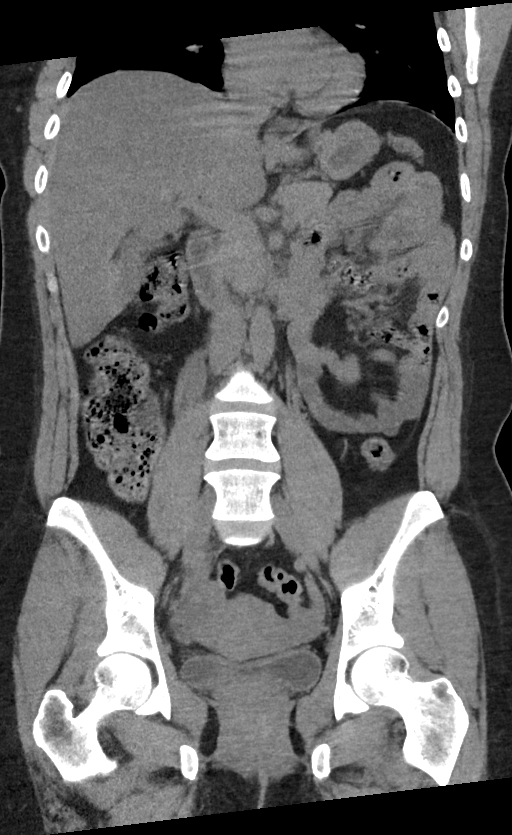
[im 67/121  soft-tissue]
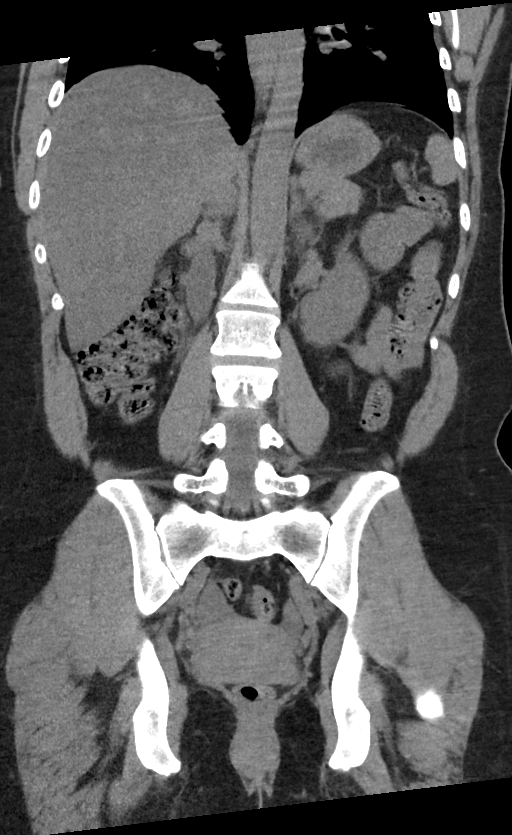

[Series 6: sagittal · sagittal · 0.52mm/px · 1 of 159 slices shown]
[im 59/159  soft-tissue]
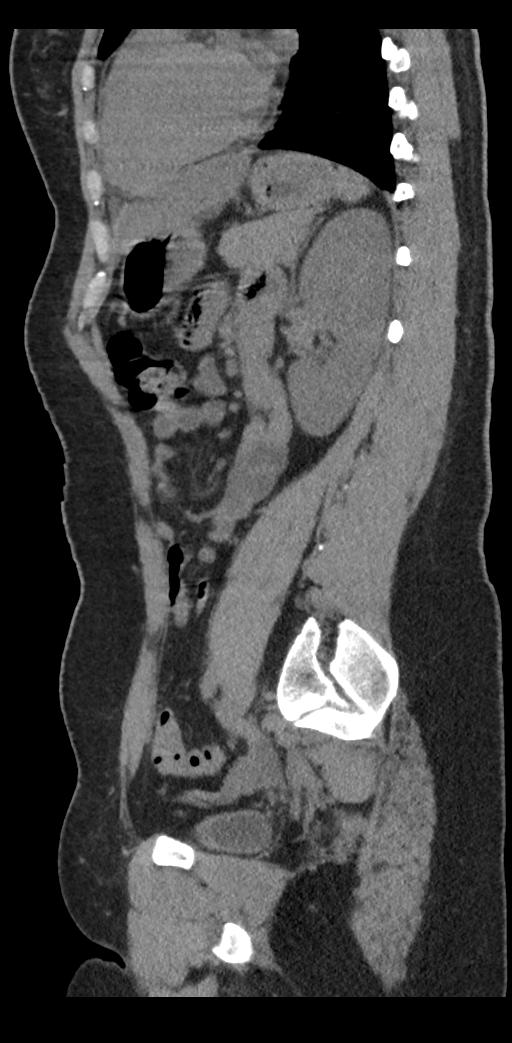

[9 of 46 positions shown; findings below may reference images not displayed]

FINDINGS: Lower chest: Lung bases demonstrate no acute consolidation or
effusion. The heart size is within normal limits

Hepatobiliary: Steatosis with fat sparing near the gallbladder
fossa. No calcified stone or biliary dilatation

Pancreas: Unremarkable. No pancreatic ductal dilatation or
surrounding inflammatory changes.

Spleen: Normal in size without focal abnormality.

Adrenals/Urinary Tract: Adrenal glands are within normal limits. No
definitive hydronephrosis. Dilated tubular structure in the right
retroperitoneum, appears to course cephalad toward the upper aspect
of the right kidney. Dilated tubular structure is visualized down to
the right pelvis. No definitive ureteral stone. The bladder is
normal

Stomach/Bowel: Stomach is within normal limits. Status post
appendectomy. No evidence of bowel wall thickening, distention, or
inflammatory changes.

Vascular/Lymphatic: No significant vascular findings are present. No
enlarged abdominal or pelvic lymph nodes.

Reproductive: Uterus unremarkable. Hypodensity at the posterior
aspect of the right uterus presumably represents the right ovary. No
obvious adnexal mass

Other: No free air or free fluid

Musculoskeletal: No acute or significant osseous findings.
IMPRESSION: 1. No definitive ureteral stone identified. No definitive
hydronephrosis. Dilated tubular structure in the right
retroperitoneum coursing from the upper aspect of the right kidney
to the pelvis. Differential considerations include hydroureter of
scarred upper pole moiety in a duplex kidney. Could also consider
gonadal vein thrombosis. Contrast-enhanced CT could be helpful to
further evaluate.
2. Hepatic steatosis with fat sparing near the gallbladder fossa

## 2019-09-20 ENCOUNTER — Other Ambulatory Visit: Payer: Self-pay

## 2019-09-20 DIAGNOSIS — Z20822 Contact with and (suspected) exposure to covid-19: Secondary | ICD-10-CM

## 2019-09-23 LAB — NOVEL CORONAVIRUS, NAA: SARS-CoV-2, NAA: NOT DETECTED

## 2020-10-09 DIAGNOSIS — M20011 Mallet finger of right finger(s): Secondary | ICD-10-CM

## 2020-10-09 HISTORY — DX: Mallet finger of right finger(s): M20.011

## 2020-11-12 ENCOUNTER — Encounter: Payer: Self-pay | Admitting: Family Medicine

## 2020-12-15 ENCOUNTER — Other Ambulatory Visit: Payer: Self-pay | Admitting: Family Medicine

## 2020-12-15 DIAGNOSIS — E669 Obesity, unspecified: Secondary | ICD-10-CM

## 2020-12-15 DIAGNOSIS — D72829 Elevated white blood cell count, unspecified: Secondary | ICD-10-CM

## 2020-12-16 ENCOUNTER — Other Ambulatory Visit (INDEPENDENT_AMBULATORY_CARE_PROVIDER_SITE_OTHER): Payer: BC Managed Care – PPO

## 2020-12-16 ENCOUNTER — Other Ambulatory Visit: Payer: Self-pay

## 2020-12-16 DIAGNOSIS — D72829 Elevated white blood cell count, unspecified: Secondary | ICD-10-CM

## 2020-12-16 DIAGNOSIS — E669 Obesity, unspecified: Secondary | ICD-10-CM | POA: Diagnosis not present

## 2020-12-16 LAB — BASIC METABOLIC PANEL
BUN: 14 mg/dL (ref 6–23)
CO2: 24 mEq/L (ref 19–32)
Calcium: 9.6 mg/dL (ref 8.4–10.5)
Chloride: 105 mEq/L (ref 96–112)
Creatinine, Ser: 0.76 mg/dL (ref 0.40–1.20)
GFR: 99.99 mL/min (ref >60.00)
Glucose, Bld: 110 mg/dL — ABNORMAL HIGH (ref 70–99)
Potassium: 3.9 mEq/L (ref 3.5–5.1)
Sodium: 136 mEq/L (ref 135–145)

## 2020-12-16 LAB — CBC WITH DIFFERENTIAL/PLATELET
Basophils Absolute: 0.1 10*3/uL (ref 0.0–0.1)
Basophils Relative: 0.9 % (ref 0.0–3.0)
Eosinophils Absolute: 0.1 10*3/uL (ref 0.0–0.7)
Eosinophils Relative: 1.2 % (ref 0.0–5.0)
HCT: 37.4 % (ref 36.0–46.0)
Hemoglobin: 12.8 g/dL (ref 12.0–15.0)
Lymphocytes Relative: 26 % (ref 12.0–46.0)
Lymphs Abs: 1.8 10*3/uL (ref 0.7–4.0)
MCHC: 34.2 g/dL (ref 30.0–36.0)
MCV: 91.5 fl (ref 78.0–100.0)
Monocytes Absolute: 0.5 10*3/uL (ref 0.1–1.0)
Monocytes Relative: 6.9 % (ref 3.0–12.0)
Neutro Abs: 4.5 10*3/uL (ref 1.4–7.7)
Neutrophils Relative %: 65 % (ref 43.0–77.0)
Platelets: 271 10*3/uL (ref 150.0–400.0)
RBC: 4.09 Mil/uL (ref 3.87–5.11)
RDW: 13 % (ref 11.5–15.5)
WBC: 7 10*3/uL (ref 4.0–10.5)

## 2020-12-16 LAB — LIPID PANEL
Cholesterol: 142 mg/dL (ref 0–200)
HDL: 69.1 mg/dL (ref >39.00)
LDL Cholesterol: 55 mg/dL (ref 0–99)
NonHDL: 73.1
Total CHOL/HDL Ratio: 2
Triglycerides: 91 mg/dL (ref 0.0–149.0)
VLDL: 18.2 mg/dL (ref 0.0–40.0)

## 2020-12-16 LAB — TSH: TSH: 1.1 u[IU]/mL (ref 0.35–4.50)

## 2020-12-25 ENCOUNTER — Ambulatory Visit (INDEPENDENT_AMBULATORY_CARE_PROVIDER_SITE_OTHER): Payer: BC Managed Care – PPO | Admitting: Family Medicine

## 2020-12-25 ENCOUNTER — Other Ambulatory Visit: Payer: Self-pay

## 2020-12-25 ENCOUNTER — Encounter: Payer: Self-pay | Admitting: Family Medicine

## 2020-12-25 VITALS — BP 116/70 | HR 94 | Temp 97.8°F | Ht 63.0 in | Wt 164.0 lb

## 2020-12-25 DIAGNOSIS — M25511 Pain in right shoulder: Secondary | ICD-10-CM | POA: Diagnosis not present

## 2020-12-25 DIAGNOSIS — Z23 Encounter for immunization: Secondary | ICD-10-CM

## 2020-12-25 DIAGNOSIS — Z0000 Encounter for general adult medical examination without abnormal findings: Secondary | ICD-10-CM

## 2020-12-25 DIAGNOSIS — G8929 Other chronic pain: Secondary | ICD-10-CM

## 2020-12-25 DIAGNOSIS — E663 Overweight: Secondary | ICD-10-CM | POA: Diagnosis not present

## 2020-12-25 DIAGNOSIS — Z01419 Encounter for gynecological examination (general) (routine) without abnormal findings: Secondary | ICD-10-CM

## 2020-12-25 NOTE — Progress Notes (Signed)
Patient ID: Teresa Willis, female    DOB: 03/17/1981, 40 y.o.   MRN: [id number]  This visit was conducted in person.  BP 116/70   Pulse 94   Temp 97.8 F (36.6 C) (Temporal)   Ht 5\' 3"  (1.6 m)   Wt 164 lb (74.4 kg)   LMP 12/23/2020   SpO2 97%   BMI 29.05 kg/m    CC: CPE Subjective:   HPI: Teresa Willis is a 40 y.o. female presenting on 12/25/2020 for Annual Exam   Spanish speaking patient.  No fmhx diabetes.   Fractured R pinky at work.  For the past 8+ months noticing R anterior shoulder pain. Managing with tylenol and advil with only temporary relief. Denies inciting trauma/injury. 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   Preventative: Well woman with OBGYN Dr 12/27/2020, pap WNL 06/2017. Would like local referral.  LMP - currently active, heavy bleeding Flu shot - declines COVID vaccine Pfizer 01/2020, 02/2020, booster 10/2020  Tetanus 2010. Update Tdap today  Seat belt use discussed Sunscreen use discussed. No changing moles on skin.  Non smoker Alcohol - none Dentist q6 mo Eye exam - has not seen  Lives with husband, son 2011 and step daughter Barbara Cower Occ: [employer]  Activity: active at work  Diet: good water intake, fruits/vegetables daily, doesn't like fish or seafood     Relevant past medical, surgical, family and social history reviewed and updated as indicated. Interim medical history since our last visit reviewed. Allergies and medications reviewed and updated. Outpatient Medications Prior to Visit  Medication Sig Dispense Refill  . fluticasone (FLONASE) 50 MCG/ACT nasal spray Place 2 sprays into both nostrils daily. 16 g 1  . ketorolac (TORADOL) 10 MG tablet Take 1 tablet (10 mg total) by mouth every 6 (six) hours as needed. 20 tablet 0  . traMADol (ULTRAM) 50 MG tablet Take 1 tablet (50 mg total) by mouth every 12 (twelve) hours as needed. 12 tablet 0   No facility-administered medications prior to visit.     Per HPI unless specifically  indicated in ROS section below Review of Systems  Constitutional: Negative for activity change, appetite change, chills, fatigue, fever and unexpected weight change.  HENT: Negative for hearing loss.   Eyes: Negative for visual disturbance.  Respiratory: Negative for cough, chest tightness, shortness of breath and wheezing.   Cardiovascular: Negative for chest pain, palpitations and leg swelling.  Gastrointestinal: Negative for abdominal distention, abdominal pain, blood in stool, constipation, diarrhea, nausea and vomiting.  Genitourinary: Negative for difficulty urinating and hematuria.  Musculoskeletal: Negative for arthralgias, myalgias and neck pain.  Skin: Negative for rash.  Neurological: Negative for dizziness, seizures, syncope and headaches.  Hematological: Negative for adenopathy. Does not bruise/bleed easily.  Psychiatric/Behavioral: Negative for dysphoric mood. The patient is not nervous/anxious.    Objective:  BP 116/70   Pulse 94   Temp 97.8 F (36.6 C) (Temporal)   Ht 5\' 3"  (1.6 m)   Wt 164 lb (74.4 kg)   LMP 12/23/2020   SpO2 97%   BMI 29.05 kg/m   Wt Readings from Last 3 Encounters:  12/25/20 164 lb (74.4 kg)  06/07/18 172 lb (78 kg)  01/12/18 182 lb (82.6 kg)      Physical Exam Vitals and nursing note reviewed.  Constitutional:      General: She is not in acute distress.    Appearance: Normal appearance. She is well-developed and well-nourished. She is not ill-appearing.  HENT:  Head: Normocephalic and atraumatic.     Right Ear: Hearing, tympanic membrane, ear canal and external ear normal.     Left Ear: Hearing, tympanic membrane, ear canal and external ear normal.     Mouth/Throat:     Mouth: Oropharynx is clear and moist and mucous membranes are normal.     Pharynx: Uvula midline. No posterior oropharyngeal edema.  Eyes:     General: No scleral icterus.    Extraocular Movements: Extraocular movements intact and EOM normal.      Conjunctiva/sclera: Conjunctivae normal.     Pupils: Pupils are equal, round, and reactive to light.  Cardiovascular:     Rate and Rhythm: Normal rate and regular rhythm.     Pulses: Normal pulses and intact distal pulses.          Radial pulses are 2+ on the right side and 2+ on the left side.     Heart sounds: Normal heart sounds. No murmur heard.   Pulmonary:     Effort: Pulmonary effort is normal. No respiratory distress.     Breath sounds: Normal breath sounds. No wheezing, rhonchi or rales.  Abdominal:     General: Abdomen is flat. Bowel sounds are normal. There is no distension.     Palpations: Abdomen is soft. There is no mass.     Tenderness: There is no abdominal tenderness. There is no guarding or rebound.     Hernia: No hernia is present.  Musculoskeletal:        General: No edema. Normal range of motion.     Cervical back: Normal range of motion and neck supple.     Right lower leg: No edema.     Left lower leg: No edema.     Comments:  FROM at neck  L shoulder WNL R shoulder exam: No deformity of shoulders on inspection. No pain with palpation of shoulder landmarks. FROM in abduction and forward flexion. No pain or weakness with testing SITS in ext/int rotation. No pain with empty can sign. Mod discomfort with Speed test. Discomfort with impingement. No significant pain with rotation of humeral head in GH joint.   Lymphadenopathy:     Cervical: No cervical adenopathy.  Skin:    General: Skin is warm and dry.     Findings: No rash.  Neurological:     General: No focal deficit present.     Mental Status: She is alert and oriented to person, place, and time.     Comments: CN grossly intact, station and gait intact  Psychiatric:        Mood and Affect: Mood and affect and mood normal.        Behavior: Behavior normal.        Thought Content: Thought content normal.        Judgment: Judgment normal.       Results for orders placed or performed in visit on  12/16/20  CBC with Differential/Platelet  Result Value Ref Range   WBC 7.0 4.0 - 10.5 K/uL   RBC 4.09 3.87 - 5.11 Mil/uL   Hemoglobin 12.8 12.0 - 15.0 g/dL   HCT 17.4 08.1 - 44.8 %   MCV 91.5 78.0 - 100.0 fl   MCHC 34.2 30.0 - 36.0 g/dL   RDW 18.5 63.1 - 49.7 %   Platelets 271.0 150.0 - 400.0 K/uL   Neutrophils Relative % 65.0 43.0 - 77.0 %   Lymphocytes Relative 26.0 12.0 - 46.0 %   Monocytes Relative 6.9  3.0 - 12.0 %   Eosinophils Relative 1.2 0.0 - 5.0 %   Basophils Relative 0.9 0.0 - 3.0 %   Neutro Abs 4.5 1.4 - 7.7 K/uL   Lymphs Abs 1.8 0.7 - 4.0 K/uL   Monocytes Absolute 0.5 0.1 - 1.0 K/uL   Eosinophils Absolute 0.1 0.0 - 0.7 K/uL   Basophils Absolute 0.1 0.0 - 0.1 K/uL  TSH  Result Value Ref Range   TSH 1.10 0.35 - 4.50 uIU/mL  Basic metabolic panel  Result Value Ref Range   Sodium 136 135 - 145 mEq/L   Potassium 3.9 3.5 - 5.1 mEq/L   Chloride 105 96 - 112 mEq/L   CO2 24 19 - 32 mEq/L   Glucose, Bld 110 (H) 70 - 99 mg/dL   BUN 14 6 - 23 mg/dL   Creatinine, Ser 7.03 0.40 - 1.20 mg/dL   GFR 50.09 >38.18 mL/min   Calcium 9.6 8.4 - 10.5 mg/dL  Lipid panel  Result Value Ref Range   Cholesterol 142 0 - 200 mg/dL   Triglycerides 29.9 0.0 - 149.0 mg/dL   HDL 37.16 >96.78 mg/dL   VLDL 93.8 0.0 - 10.1 mg/dL   LDL Cholesterol 55 0 - 99 mg/dL   Total CHOL/HDL Ratio 2    NonHDL 73.10    Assessment & Plan:  This visit occurred during the SARS-CoV-2 public health emergency.  Safety protocols were in place, including screening questions prior to the visit, additional usage of staff PPE, and extensive cleaning of exam room while observing appropriate contact time as indicated for disinfecting solutions.   Problem List Items Addressed This Visit    Right shoulder pain    Ongoing for 6+ months.  Exam suspicious for biceps tendinopathy vs impingement.  Provided with exercises from [facility] pt advisor along with resistance band.  Update if not improving, discussed return to see our  sports med physician Dr Patsy Lager for further eval if home exercises don't help       Overweight (BMI 25.0-29.9)    Congratulated on weight loss to date.       Health maintenance examination - Primary    Preventative protocols reviewed and updated unless pt declined. Discussed healthy diet and lifestyle.        Other Visit Diagnoses    Well woman exam       Relevant Orders   Ambulatory referral to Gynecology   Need for Tdap vaccination       Relevant Orders   Tdap vaccine greater than or equal to 7yo IM (Completed)       No orders of the defined types were placed in this encounter.  Orders Placed This Encounter  Procedures  . Tdap vaccine greater than or equal to 7yo IM  . Ambulatory referral to Gynecology    Referral Priority:   Routine    Referral Type:   Consultation    Referral Reason:   Specialty Services Required    Requested Specialty:   Gynecology    Number of Visits Requested:   1    Patient instructions:  Tdap today  [facility]mos [facility]  Creo que tiene tendonitis de musculo biceps - trate ejercicios. si no mejora, regresar a ver Dr Patsy Lager medico deportista aqui en la clinica.  Gusto verla hoy. regresar en 1-2 aos para proximo examen fisico.  Follow up plan: Return in about 2 years (around 12/25/2022) for annual exam, prior fasting for blood work.  Eustaquio Boyden, MD

## 2020-12-25 NOTE — Assessment & Plan Note (Signed)
Ongoing for 6+ months.  Exam suspicious for biceps tendinopathy vs impingement.  Provided with exercises from [facility] pt advisor along with resistance band.  Update if not improving, discussed return to see our sports med physician Dr Patsy Lager for further eval if home exercises don't help

## 2020-12-25 NOTE — Assessment & Plan Note (Signed)
Congratulated on weight loss to date. 

## 2020-12-25 NOTE — Patient Instructions (Addendum)
Tdap today  La remitiremos a ginecologo local ([facility]  Creo que tiene tendonitis de musculo biceps - trate ejercicios. si no mejora, regresar a ver Dr Patsy Lager medico deportista aqui en la clinica.  Gusto verla hoy. regresar en 1-2 aos para proximo examen fisico.  Mantenimiento de Radiographer, therapeutic en las [facility], Female Adoptar un estilo de vida saludable y recibir atencin preventiva son importantes para promover la salud y Counsellor. Consulte al mdico sobre:  El esquema adecuado para hacerse pruebas y exmenes peridicos.  Cosas que puede hacer por su cuenta para prevenir enfermedades y [employer]. Qu debo saber sobre la dieta, el peso y el ejercicio? Consuma una dieta saludable  Consuma una dieta que incluya muchas verduras, frutas, productos lcteos con bajo contenido de [place] y Associate Professor.  No consuma muchos alimentos ricos en grasas slidas, azcares agregados o sodio.   Mantenga un peso saludable El ndice de masa muscular [facility]) se [place] para identificar problemas de [place]. Proporciona una estimacin de la grasa corporal basndose en el peso y la altura. Su mdico puede ayudarle a Engineer, site IMC y a Personnel officer o Pharmacologist un peso saludable. Haga ejercicio con regularidad Haga ejercicio con regularidad. Esta es una de las prcticas ms importantes que puede hacer por su salud. La mayora de los adultos deben seguir estas pautas:  Education officer, environmental, al menos, de actividad fsica por semana. El ejercicio debe aumentar la frecuencia cardaca y Media planner transpirar (ejercicio de intensidad moderada).  Hacer ejercicios de fortalecimiento por lo [employer] por semana. Agregue esto a su plan de ejercicio de intensidad moderada.  Pasar menos tiempo sentados. Incluso la actividad fsica ligera puede ser beneficiosa. Controle sus niveles de colesterol y lpidos en la sangre Comience a realizarse anlisis de lpidos y Oncologist en la sangre a los  20aos y luego reptalos cada 5aos. Hgase controlar los niveles de colesterol con mayor frecuencia si:  Sus niveles de lpidos y colesterol son altos.  Es mayor de 40aos.  Presenta un alto riesgo de padecer enfermedades cardacas. Qu debo saber sobre las pruebas de deteccin del cncer? Segn su historia clnica y sus antecedentes familiares, es posible que deba realizarse pruebas de deteccin del cncer en diferentes edades. Esto puede incluir pruebas de deteccin de lo siguiente:  Cncer de mama.  Cncer de cuello uterino.  Cncer colorrectal.  Cncer de piel.  Cncer de pulmn. Qu debo saber sobre la enfermedad cardaca, la diabetes y la hipertensin arterial? Presin arterial y enfermedad cardaca  La hipertensin arterial causa enfermedades cardacas y [place] el riesgo de accidente cerebrovascular. Es ms probable que esto se manifieste en las personas que tienen lecturas de presin arterial alta, tienen ascendencia africana o tienen sobrepeso.  Hgase controlar la presin arterial: ? Cada 3 a 5 aos si tiene entre 18 y 3 aos. ? Todos los aos si es mayor de [place]. Diabetes Realcese exmenes de deteccin de la diabetes con regularidad. Este anlisis revisa el nivel de azcar en la sangre en [place]. Hgase las pruebas de deteccin:  Cada tresaos despus de los 40aos de edad si tiene un peso normal y un bajo riesgo de padecer diabetes.  Con ms frecuencia y a partir de [place] edad inferior si tiene sobrepeso o un alto riesgo de padecer diabetes. Qu debo saber sobre la prevencin de infecciones? Hepatitis B Si tiene un riesgo ms alto de contraer hepatitis B, debe someterse a un examen de deteccin de este virus. Hable con el  mdico para averiguar si tiene riesgo de contraer la infeccin por hepatitis B. Hepatitis C Se recomienda el anlisis a:  [employer] 1945 y 1965.  Todas las personas que tengan un riesgo de haber contrado  hepatitis C. Enfermedades de transmisin sexual (ETS)  Hgase las pruebas de Airline pilot de ITS, incluidas la gonorrea y la clamidia, si: ? Es sexualmente activa y es menor de [place]. ? Es mayor de 24aos, y Public affairs consultant informa que corre riesgo de tener este tipo de infecciones. ? La actividad sexual ha cambiado desde que le hicieron la ltima prueba de deteccin y tiene un riesgo mayor de Warehouse manager clamidia o Copy. Pregntele al mdico si usted tiene riesgo.  Pregntele al mdico si usted tiene un alto riesgo de Primary school teacher VIH. El mdico tambin puede recomendarle un medicamento recetado para ayudar a evitar la infeccin por el VIH. Si elige tomar medicamentos para prevenir el VIH, primero debe [employer] de deteccin del VIH. Luego debe hacerse anlisis cada mientras est tomando los medicamentos. Embarazo  Si est por dejar de Armed forces training and education officer (fase premenopusica) y usted puede quedar [place], busque asesoramiento antes de [place].  Tome de 400 a (mcg) de cido [employer] si [place].  Pida mtodos de control de la natalidad (anticonceptivos) si desea evitar un embarazo no deseado. Osteoporosis y [place] La osteoporosis es una enfermedad en la que los huesos pierden los minerales y la fuerza por el avance de la edad. El resultado pueden ser fracturas en los [place]. Si tiene 65aos o ms, o si est en riesgo de sufrir osteoporosis y fracturas, pregunte a su mdico si debe:  Hacerse pruebas de deteccin de prdida sea.  Tomar un suplemento de calcio o de vitamina D para reducir el riesgo de fracturas.  Recibir terapia de reemplazo hormonal (TRH) para tratar los sntomas de la menopausia. Siga estas instrucciones en su casa: Estilo de vida  No consuma ningn producto que contenga nicotina o tabaco, como cigarrillos, cigarrillos electrnicos y tabaco de Theatre manager. Si necesita ayuda para dejar de fumar, consulte al mdico.  No  consuma drogas.  No comparta agujas.  Solicite ayuda a su mdico si necesita apoyo o informacin para abandonar las drogas. Consumo de alcohol  No beba alcohol si: ? Su mdico le indica no hacerlo. ? Est embarazada, puede estar embarazada o est tratando de quedar embarazada.  Si bebe alcohol: ? Limite la cantidad que consume de 0 a 1 medida por da. ? Limite la ingesta si est amamantando.  Est atento a la cantidad de alcohol que hay en las bebidas que toma. En los [place], una medida equivale a una botella de cerveza de 12oz ( ), un vaso de vino de 5oz ( ) o un vaso de una bebida alcohlica de alta graduacin de 1oz (51ml). Instrucciones generales  Realcese los estudios de rutina de la salud, dentales y de Wellsite geologist.  Mantngase al da con las vacunas.  Infrmele a su mdico si: ? Se siente deprimida con frecuencia. ? Alguna vez ha sido vctima de [place] o no se siente segura en su casa. Resumen  Adoptar un estilo de vida saludable y recibir atencin preventiva son importantes para promover la salud y Counsellor.  Siga las instrucciones del mdico acerca de una dieta saludable, el ejercicio y la realizacin de pruebas o exmenes para Hotel manager.  Siga las instrucciones del mdico con respecto al control del colesterol y la presin arterial. [place]  informacin no tiene Theme park manager el consejo del mdico. Asegrese de hacerle al mdico cualquier pregunta que tenga. Document Revised: 11/16/2018 Document Reviewed: 11/16/2018 Elsevier Patient Education  2021 [employer].

## 2020-12-25 NOTE — Assessment & Plan Note (Signed)
Preventative protocols reviewed and updated unless pt declined. Discussed healthy diet and lifestyle.  

## 2021-02-26 ENCOUNTER — Ambulatory Visit: Payer: BC Managed Care – PPO | Admitting: Family Medicine

## 2021-02-26 ENCOUNTER — Other Ambulatory Visit: Payer: Self-pay

## 2021-02-26 ENCOUNTER — Encounter: Payer: Self-pay | Admitting: Family Medicine

## 2021-02-26 ENCOUNTER — Other Ambulatory Visit (HOSPITAL_COMMUNITY)
Admission: RE | Admit: 2021-02-26 | Discharge: 2021-02-26 | Disposition: A | Discharge status: Home / Self Care | Payer: BC Managed Care – PPO | Source: Ambulatory Visit | Attending: Family Medicine | Admitting: Family Medicine

## 2021-02-26 VITALS — BP 106/71 | HR 80 | Ht 65.0 in | Wt 159.2 lb

## 2021-02-26 DIAGNOSIS — Z01419 Encounter for gynecological examination (general) (routine) without abnormal findings: Secondary | ICD-10-CM

## 2021-02-26 NOTE — Progress Notes (Signed)
   GYNECOLOGY ANNUAL PREVENTATIVE CARE ENCOUNTER NOTE  Subjective:   Teresa Willis is a 40 y.o. [id number] female here for a routine annual gynecologic exam.  Current complaints: long menses with brown spotting. Also here to establish care.     Menses about 6-7days Reports brown discharge for several days   Denies abnormal vaginal bleeding, discharge, pelvic pain, problems with intercourse or other gynecologic concerns.    Gynecologic History Patient's last menstrual period was 02/19/2021 (approximate). Contraception: vasectomy Last Pap: about 2 years ago. Results were: normal- per report Last mammogram: NA  The following portions of the patient's history were reviewed and updated as appropriate: allergies, current medications, past family history, past medical history, past social history, past surgical history and problem list.  Review of Systems Pertinent items are noted in HPI.   Objective:  BP 106/71   Pulse 80   Ht 5\' 5"  (1.651 m)   Wt 159 lb 3.2 oz (72.2 kg)   LMP 02/19/2021 (Approximate)   BMI 26.49 kg/m  CONSTITUTIONAL: Well-developed, well-nourished female in no acute distress.  HENT:  Normocephalic, atraumatic, External right and left ear normal. Oropharynx is clear and moist EYES:  No scleral icterus.  NECK: Normal range of motion, supple, no masses.  Normal thyroid.  SKIN: Skin is warm and dry. No rash noted. Not diaphoretic. No erythema. No pallor. NEUROLOGIC: Alert and oriented to person, place, and time. Normal reflexes, muscle tone coordination. No cranial nerve deficit noted. PSYCHIATRIC: Normal mood and affect. Normal behavior. Normal judgment and thought content. CARDIOVASCULAR: Normal heart rate noted, regular rhythm. 2+ distal pulses. RESPIRATORY: Effort and breath sounds normal, no problems with respiration noted. BREASTS: Symmetric in size. No masses, skin changes, nipple drainage, or lymphadenopathy. ABDOMEN: Soft,  no distention noted.  No  tenderness, rebound or guarding.  PELVIC: Normal appearing external genitalia; normal appearing vaginal mucosa and cervix.  No abnormal discharge noted.  Pap smear obtained.  Normal uterine size, no other palpable masses, no uterine or adnexal tenderness. MUSCULOSKELETAL: Normal range of motion.    Assessment and Plan:  1) Annual gynecologic examination with pap smear:  Will follow up results of pap smear and manage accordingly. STI screen also ordered today.  Routine preventative health maintenance measures emphasized.   1. Well woman exam with routine gynecological exam Pap today   Please refer to After Visit Summary for other counseling recommendations.   No follow-ups on file.  02/21/2021, MD, MPH, ABFM Attending Physician [facility]

## 2021-02-28 LAB — CYTOLOGY - PAP
Comment: NEGATIVE
Diagnosis: UNDETERMINED — AB
High risk HPV: NEGATIVE
